# Patient Record
Sex: Female | Born: 2000 | Race: Black or African American | Hispanic: No | Marital: Single | Smoking: Current every day smoker
Health system: Southern US, Community
[De-identification: ages and names within clinical notes are randomized; demographics above are authoritative.]

---

## 2019-05-18 ENCOUNTER — Ambulatory Visit (HOSPITAL_COMMUNITY)
Admission: EM | Admit: 2019-05-18 | Discharge: 2019-05-18 | Disposition: A | Discharge status: Home / Self Care | Attending: Family Medicine | Admitting: Family Medicine

## 2019-05-18 ENCOUNTER — Other Ambulatory Visit: Payer: Self-pay

## 2019-05-18 ENCOUNTER — Encounter (HOSPITAL_COMMUNITY): Payer: Self-pay

## 2019-05-18 DIAGNOSIS — L0291 Cutaneous abscess, unspecified: Secondary | ICD-10-CM

## 2019-05-18 DIAGNOSIS — L0231 Cutaneous abscess of buttock: Secondary | ICD-10-CM

## 2019-05-18 MED ORDER — CEPHALEXIN 500 MG PO CAPS: 500.0000 mg | ORAL_CAPSULE | Freq: Four times a day (QID) | ORAL | 0 refills | Status: AC

## 2019-05-18 NOTE — ED Provider Notes (Addendum)
[place]    CSN: [id number] Arrival date & time: 05/18/19  1033      History   Chief Complaint Chief Complaint  Patient presents with  . Abscess    HPI Deriona Altemose is a 18 y.o. female.    Abscess Location:  Pelvis Pelvic abscess location:  L buttock Size:  4 Abscess quality: fluctuance and painful   Abscess quality: not draining, no induration, no itching, no redness, no warmth and not weeping   Red streaking: no   Duration:  2 days Progression:  Worsening Pain details:    Quality:  Pressure   Severity:  Moderate Chronicity:  Recurrent Context: not diabetes, not immunosuppression, not injected drug use, not insect bite/sting and not skin injury   Relieved by:  Nothing Worsened by:  Nothing Ineffective treatments:  Warm compresses Associated symptoms: no anorexia, no fatigue, no fever, no headaches, no nausea and no vomiting   Risk factors: prior abscess   Risk factors: no family hx of MRSA and no hx of MRSA     History reviewed. No pertinent past medical history.  There are no problems to display for this patient.   History reviewed. No pertinent surgical history.  OB History   No obstetric history on file.      Home Medications    Prior to Admission medications   Medication Sig Start Date End Date Taking? Authorizing Provider  cephALEXin (KEFLEX) 500 MG capsule Take 1 capsule (500 mg total) by mouth 4 (four) times daily. 05/18/19   Orvan July, NP    Family History Family History  Family history unknown: Yes    Social History Social History   Tobacco Use  . Smoking status: Current Every Day Smoker  . Smokeless tobacco: Never Used  Substance Use Topics  . Alcohol use: Not on file  . Drug use: Yes    Types: Marijuana     Allergies   Patient has no known allergies.   Review of Systems Review of Systems  Constitutional: Negative for fatigue and fever.  Gastrointestinal: Negative for anorexia, nausea and  vomiting.  Neurological: Negative for headaches.     Physical Exam Triage Vital Signs ED Triage Vitals  Enc Vitals Group     BP 05/18/19 1052 123/77     Pulse Rate 05/18/19 1052 73     Resp 05/18/19 1052 18     Temp 05/18/19 1052 98.6 F (37 C)     Temp Source 05/18/19 1052 Oral     SpO2 05/18/19 1052 100 %     Weight --      Height --      Head Circumference --      Peak Flow --      Pain Score 05/18/19 1054 6     Pain Loc --      Pain Edu? --      Excl. in [place]? --    No data found.  Updated Vital Signs BP 123/77 (BP Location: Right Arm)   Pulse 73   Temp 98.6 F (37 C) (Oral)   Resp 18   LMP 05/17/2019   SpO2 100%   Visual Acuity Right Eye Distance:   Left Eye Distance:   Bilateral Distance:    Right Eye Near:   Left Eye Near:    Bilateral Near:     Physical Exam Vitals and nursing note reviewed.  Constitutional:      General: She is not in acute distress.  Appearance: Normal appearance. She is not ill-appearing, toxic-appearing or diaphoretic.  HENT:     Head: Normocephalic.     Nose: Nose normal.     Mouth/Throat:     Pharynx: Oropharynx is clear.  Eyes:     Conjunctiva/sclera: Conjunctivae normal.  Pulmonary:     Effort: Pulmonary effort is normal.  Genitourinary:      Comments: Approx 4 cm fluctuant abscess to left buttocks. No draining. Tender to touch.  Musculoskeletal:        General: Normal range of motion.     Cervical back: Normal range of motion.  Skin:    General: Skin is warm and dry.     Findings: No rash.  Neurological:     Mental Status: She is alert.  Psychiatric:        Mood and Affect: Mood normal.      UC Treatments / Results  Labs (all labs ordered are listed, but only abnormal results are displayed) Labs Reviewed - No data to display  EKG   Radiology No results found.  Procedures Incision and Drainage  Date/Time: 05/18/2019 11:48 AM Performed by: Janace Aris, NP Authorized by: Janace Aris, NP    Consent:    Consent obtained:  Verbal   Consent given by:  Patient   Risks discussed:  Bleeding, incomplete drainage and pain   Alternatives discussed:  No treatment Universal protocol:    Patient identity confirmed:  Verbally with patient Location:    Type:  Abscess   Size:  4   Location:  Anogenital Pre-procedure details:    Skin preparation:  Betadine Anesthesia (see MAR for exact dosages):    Anesthesia method:  Local infiltration   Local anesthetic:  Lidocaine 2% w/o epi Procedure type:    Complexity:  Simple Procedure details:    Needle aspiration: no     Incision types:  Single straight   Incision depth:  Subcutaneous   Scalpel blade:  11   Wound management:  Probed and deloculated   Drainage:  Purulent and bloody   Drainage amount:  Moderate   Wound treatment:  Wound left open   Packing materials:  None Post-procedure details:    Patient tolerance of procedure:  Tolerated well, no immediate complications   (including critical care time)  Medications Ordered in UC Medications - No data to display  Initial Impression / Assessment and Plan / UC Course  I have reviewed the triage vital signs and the nursing notes.  Pertinent labs & imaging results that were available during my care of the patient were reviewed by me and considered in my medical decision making (see chart for details).     Abscess- I&d here in the clinic, pt tolerated well.  Will have her continue warm compresses and keflex for abx coverage.  Follow up as needed for continued or worsening symptoms  Final Clinical Impressions(s) / UC Diagnoses   Final diagnoses:  Abscess     Discharge Instructions     Take the antibiotics as prescribed.  Continue the warm compresses.  Ibuprofen 600 mg every 8 hours for pain  Follow up as needed for continued or worsening symptoms     ED Prescriptions    Medication Sig Dispense Auth. Provider   cephALEXin (KEFLEX) 500 MG capsule Take 1 capsule  (500 mg total) by mouth 4 (four) times daily. 28 capsule Mckenzye Cutright A, NP     PDMP not reviewed this encounter.   Janace Aris, NP 05/18/19  1147    Dahlia ByesBast, Greydis Stlouis A, NP 05/18/19 1148

## 2019-05-18 NOTE — ED Triage Notes (Signed)
Pt presents with abscess on left outer vaginal crease X 2 days.

## 2019-05-18 NOTE — Discharge Instructions (Signed)
Take the antibiotics as prescribed.  Continue the warm compresses.  Ibuprofen 600 mg every 8 hours for pain  Follow up as needed for continued or worsening symptoms

## 2019-06-10 ENCOUNTER — Emergency Department (HOSPITAL_COMMUNITY): Payer: Self-pay

## 2019-06-10 ENCOUNTER — Encounter (HOSPITAL_COMMUNITY): Payer: Self-pay | Admitting: Emergency Medicine

## 2019-06-10 ENCOUNTER — Other Ambulatory Visit: Payer: Self-pay

## 2019-06-10 ENCOUNTER — Emergency Department (HOSPITAL_COMMUNITY)
Admission: EM | Admit: 2019-06-10 | Discharge: 2019-06-10 | Disposition: A | Discharge status: Home / Self Care | Payer: Self-pay | Attending: Emergency Medicine | Admitting: Emergency Medicine

## 2019-06-10 DIAGNOSIS — Z5321 Procedure and treatment not carried out due to patient leaving prior to being seen by health care provider: Secondary | ICD-10-CM | POA: Insufficient documentation

## 2019-06-10 DIAGNOSIS — R2242 Localized swelling, mass and lump, left lower limb: Secondary | ICD-10-CM | POA: Insufficient documentation
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
